# Patient Record
Sex: Female | Born: 1995 | Race: White | Hispanic: No | Marital: Single | Smoking: Never smoker
Health system: Southern US, Community
[De-identification: ages and names within clinical notes are randomized; demographics above are authoritative.]

## PROBLEM LIST (undated history)

## (undated) DIAGNOSIS — G43909 Migraine, unspecified, not intractable, without status migrainosus: Secondary | ICD-10-CM

## (undated) DIAGNOSIS — F419 Anxiety disorder, unspecified: Secondary | ICD-10-CM

## (undated) DIAGNOSIS — J309 Allergic rhinitis, unspecified: Secondary | ICD-10-CM

## (undated) DIAGNOSIS — F32A Depression, unspecified: Secondary | ICD-10-CM

## (undated) DIAGNOSIS — F909 Attention-deficit hyperactivity disorder, unspecified type: Secondary | ICD-10-CM

## (undated) HISTORY — DX: Attention-deficit hyperactivity disorder, unspecified type: F90.9

## (undated) HISTORY — DX: Allergic rhinitis, unspecified: J30.9

## (undated) HISTORY — DX: Depression, unspecified: F32.A

## (undated) HISTORY — PX: NO PAST SURGERIES: SHX2092

## (undated) HISTORY — DX: Anxiety disorder, unspecified: F41.9

---
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
## 2020-04-19 DIAGNOSIS — J302 Other seasonal allergic rhinitis: Secondary | ICD-10-CM | POA: Diagnosis not present

## 2020-04-19 DIAGNOSIS — R059 Cough, unspecified: Secondary | ICD-10-CM | POA: Diagnosis not present

## 2020-04-19 DIAGNOSIS — R52 Pain, unspecified: Secondary | ICD-10-CM | POA: Diagnosis not present

## 2020-05-10 DIAGNOSIS — Z79899 Other long term (current) drug therapy: Secondary | ICD-10-CM | POA: Diagnosis not present

## 2020-05-11 DIAGNOSIS — Z79899 Other long term (current) drug therapy: Secondary | ICD-10-CM | POA: Diagnosis not present

## 2020-05-11 DIAGNOSIS — F8181 Disorder of written expression: Secondary | ICD-10-CM | POA: Diagnosis not present

## 2020-05-11 DIAGNOSIS — F419 Anxiety disorder, unspecified: Secondary | ICD-10-CM | POA: Diagnosis not present

## 2020-05-11 DIAGNOSIS — F902 Attention-deficit hyperactivity disorder, combined type: Secondary | ICD-10-CM | POA: Diagnosis not present

## 2020-05-17 DIAGNOSIS — Z0000 Encounter for general adult medical examination without abnormal findings: Secondary | ICD-10-CM | POA: Diagnosis not present

## 2020-05-29 ENCOUNTER — Encounter: Payer: Self-pay | Admitting: Registered"

## 2020-05-29 ENCOUNTER — Encounter: Payer: BC Managed Care – PPO | Attending: Obstetrics and Gynecology | Admitting: Registered"

## 2020-05-29 ENCOUNTER — Other Ambulatory Visit: Payer: Self-pay

## 2020-05-29 DIAGNOSIS — E282 Polycystic ovarian syndrome: Secondary | ICD-10-CM | POA: Insufficient documentation

## 2020-05-29 DIAGNOSIS — Z713 Dietary counseling and surveillance: Secondary | ICD-10-CM | POA: Insufficient documentation

## 2020-05-29 NOTE — Progress Notes (Signed)
Medical Nutrition Therapy  Appointment Start time:  0830  Appointment End time:  0930  Primary concerns today: PCOS, wants to prevent diabetes and other issues  Referral diagnosis: E28.2 (ICD-10-CM) - PCOS (polycystic ovarian syndrome) Preferred learning style: no preference indicated Learning readiness: change in progress   NUTRITION ASSESSMENT   Anthropometrics  Not assessed   Clinical Medical Hx: PCOS, anxiety, ADD Medications: reviewed Labs: PCOS panel; FBS 100 (pt reported) Notable Signs/Symptoms: low energy, irregular periods  Lifestyle & Dietary Hx Patient states after diagnosis she made some changes and starting doing more meal prep and eating out less. Pt states she really enjoys cheese bites from [employer] and used to get them as part of a meal that included a large drink and fries. Pt states she has stopped doing that.  Patient states part of her fear of the potential risks associated with PCOS comes from her family history of diabetes (grandmother). Pt reports she has a twin sister who struggles with her weight but doesn't have PCOS.  Patient reports energy levels go up and down through the day; coffee peps her up, sleepy after eating.   Provided supplement sample:  Ovasitol Lot # [id number], Pst: 08/22  Estimated daily fluid intake: water 30 oz, coffee 20 oz Supplements: none Sleep: 8+ hours, wakes up 50% of the times Stress / self-care: 7/10 avg weekly a lot due to new employment / game on phone plays with people all over the world, cats, videos, vent with mom Current average weekly physical activity: ADLs, takes stairs  24-Hr Dietary Recall First Meal: skip, coffee oat creamer, cracker Snack: none Second Meal: vegetable sautee, hard boiled eggs, strawberries, almonds. Snack: "I wish I had time at work" Third Meal: cheese sticks, vegetables OR salmon Snack: none  Beverages: water, coffee  Estimated Energy Needs Calories: ~2,000    NUTRITION DIAGNOSIS  NB-1.1  Food and nutrition-related knowledge deficit As related to how food affects PCOS.  As evidenced by patient questions about what to eat for PCOS.   NUTRITION INTERVENTION  Nutrition education (E-1) on the following topics:  . PCOS pathophysiology . PCOS symptoms . Supplements . Balanced Eating  Handouts Provided Include   PCOS Guide for Parents  Inositol and PCOS (and sample)  Should I Eat? Flowsheet  Learning Style & Readiness for Change Teaching method utilized: Visual & Auditory  Demonstrated degree of understanding via: Teach Back  Barriers to learning/adherence to lifestyle change: none  Goals Established by Pt . Work on following general guidelines of MyPlate . Continue to listen to body cues as to what feels good and why . Review 'Should I Eat' flowsheet   MONITORING & EVALUATION Dietary intake, weekly physical activity, and suppplements in 4-6 weeks.  Next Steps  Patient is to work on goals and return for more in-depth discussion on mindful eating and how physical activity plays a role. Next visit will look at reading food labels.

## 2020-05-29 NOTE — Patient Instructions (Signed)
Use MyPlate as a guide for balanced eating Consider eating some protein in the morning before work Protein shakes may be a way to have something in between meals when hungry at work. Review the Am I Hungry handout for discussion at next visit

## 2020-07-17 ENCOUNTER — Ambulatory Visit: Payer: BC Managed Care – PPO | Admitting: Registered"

## 2020-08-25 DIAGNOSIS — Z79899 Other long term (current) drug therapy: Secondary | ICD-10-CM | POA: Diagnosis not present

## 2020-08-25 DIAGNOSIS — F902 Attention-deficit hyperactivity disorder, combined type: Secondary | ICD-10-CM | POA: Diagnosis not present

## 2021-09-19 ENCOUNTER — Encounter: Payer: Self-pay | Admitting: *Deleted

## 2021-09-20 ENCOUNTER — Ambulatory Visit: Payer: BC Managed Care – PPO | Admitting: Neurology

## 2021-09-20 ENCOUNTER — Encounter: Payer: Self-pay | Admitting: Neurology

## 2021-09-20 VITALS — BP 123/78 | HR 83 | Ht 62.0 in | Wt 234.8 lb

## 2021-09-20 DIAGNOSIS — G43101 Migraine with aura, not intractable, with status migrainosus: Secondary | ICD-10-CM

## 2021-09-20 DIAGNOSIS — R51 Headache with orthostatic component, not elsewhere classified: Secondary | ICD-10-CM

## 2021-09-20 DIAGNOSIS — H539 Unspecified visual disturbance: Secondary | ICD-10-CM

## 2021-09-20 DIAGNOSIS — R519 Headache, unspecified: Secondary | ICD-10-CM

## 2021-09-20 DIAGNOSIS — R200 Anesthesia of skin: Secondary | ICD-10-CM

## 2021-09-20 DIAGNOSIS — E669 Obesity, unspecified: Secondary | ICD-10-CM

## 2021-09-20 DIAGNOSIS — R4701 Aphasia: Secondary | ICD-10-CM

## 2021-09-20 DIAGNOSIS — R29810 Facial weakness: Secondary | ICD-10-CM

## 2021-09-20 MED ORDER — ONDANSETRON 4 MG PO TBDP: 4.0000 mg | ORAL_TABLET | Freq: Three times a day (TID) | ORAL | 3 refills | Status: DC | PRN

## 2021-09-20 MED ORDER — RIZATRIPTAN BENZOATE 10 MG PO TBDP: 10.0000 mg | ORAL_TABLET | ORAL | 11 refills | Status: DC | PRN

## 2021-09-20 MED ORDER — NURTEC 75 MG PO TBDP: 75.0000 mg | ORAL_TABLET | Freq: Every day | ORAL | 0 refills | Status: DC | PRN

## 2021-09-20 NOTE — Patient Instructions (Addendum)
- FL-41 filter for glasses(theraspects, somnilight) and blue light filters - Emergent: RIGHT at the onset of migraine take Rizatriptan: Please take one tablet at the onset of your headache. If it does not improve the symptoms please take one additional tablet. Do not take more then 2 tablets in 24hrs. Do not take use more then 2 to 3 times in a week. If you don;t like the triptans, would move to the new medications Ubrelvy or Nurtec - Nurtec: At onset of headache/migraine, 12 hour 1/2 life so it lasts a long time. Can take it alone or take with the rizatriptan and the ondansetron.  - Ondansetron for nausea: Take it with Rizatriptan  - Neck tightness - dry needling([email or web address] or physical therapy) or can try chiropractor or Flexeril at bedtime for neck spasms - IF we decide on a preventative, would try Topiramate/Topamax and then move to the newer meds like Nurtec, qulipta, Ajovy, Emgality - MRI of the brain w/wo (If expensive can hold off) Rimegepant Disintegrating Tablets What is this medication? RIMEGEPANT (ri ME je pant) prevents and treats migraines. It works by blocking a substance in the body that causes migraines. This medicine may be used for other purposes; ask your health care provider or pharmacist if you have questions. COMMON BRAND NAME(S): NURTEC ODT What should I tell my care team before I take this medication? They need to know if you have any of these conditions: Kidney disease Liver disease An unusual or allergic reaction to rimegepant, other medications, foods, dyes, or preservatives Pregnant or trying to get pregnant Breast-feeding How should I use this medication? Take this medication by mouth. Take it as directed on the prescription label. Leave the tablet in the sealed pack until you are ready to take it. With dry hands, open the pack and gently remove the tablet. If the tablet breaks or crumbles, throw it away. Use a new tablet. Place the tablet in the mouth and allow  it to dissolve. Then, swallow it. Do not cut, crush, or chew this medication. You do not need water to take this medication. Talk to your care team about the use of this medication in children. Special care may be needed. Overdosage: If you think you have taken too much of this medicine contact a poison control center or emergency room at once. NOTE: This medicine is only for you. Do not share this medicine with others. What if I miss a dose? This does not apply. This medication is not for regular use. What may interact with this medication? Certain medications for fungal infections, such as fluconazole, itraconazole Rifampin This list may not describe all possible interactions. Give your health care provider a list of all the medicines, herbs, non-prescription drugs, or dietary supplements you use. Also tell them if you smoke, drink alcohol, or use illegal drugs. Some items may interact with your medicine. What should I watch for while using this medication? Visit your care team for regular checks on your progress. Tell your care team if your symptoms do not start to get better or if they get worse. What side effects may I notice from receiving this medication? Side effects that you should report to your care team as soon as possible: Allergic reactions--skin rash, itching, hives, swelling of the face, lips, tongue, or throat Side effects that usually do not require medical attention (report to your care team if they continue or are bothersome): Nausea Stomach pain This list may not describe all possible side effects. Call your doctor  for medical advice about side effects. You may report side effects to FDA at 1-800-FDA-1088. Where should I keep my medication? Keep out of the reach of children and pets. Store at room temperature between 20 and 25 degrees C (68 and 77 degrees F). Get rid of any unused medication after the expiration date. To get rid of medications that are no longer needed or  have expired: Take the medication to a medication take-back program. Check with your pharmacy or law enforcement to find a location. If you cannot return the medication, check the label or package insert to see if the medication should be thrown out in the garbage or flushed down the toilet. If you are not sure, ask your care team. If it is safe to put it in the trash, take the medication out of the container. Mix the medication with cat litter, dirt, coffee grounds, or other unwanted substance. Seal the mixture in a bag or container. Put it in the trash. NOTE: This sheet is a summary. It may not cover all possible information. If you have questions about this medicine, talk to your doctor, pharmacist, or health care provider.  2023 Elsevier/Gold Standard (2021-05-09 00:00:00) Rizatriptan Disintegrating Tablets What is this medication? RIZATRIPTAN (rye za TRIP tan) treats migraines. It works by blocking pain signals and narrowing blood vessels in the brain. It belongs to a group of medications called triptans. It is not used to prevent migraines. This medicine may be used for other purposes; ask your health care provider or pharmacist if you have questions. COMMON BRAND NAME(S): Maxalt-MLT What should I tell my care team before I take this medication? They need to know if you have any of these conditions: Cigarette smoker Circulation problems in fingers and toes Diabetes Heart disease High blood pressure High cholesterol History of irregular heartbeat History of stroke Kidney disease Liver disease Stomach or intestine problems An unusual or allergic reaction to rizatriptan, other medications, foods, dyes, or preservatives Pregnant or trying to get pregnant Breast-feeding How should I use this medication? Take this medication by mouth. Follow the directions on the prescription label. Leave the tablet in the sealed blister pack until you are ready to take it. With dry hands, open the  blister and gently remove the tablet. If the tablet breaks or crumbles, throw it away and take a new tablet out of the blister pack. Place the tablet in the mouth and allow it to dissolve, and then swallow. Do not cut, crush, or chew this medication. You do not need water to take this medication. Do not take it more often than directed. Talk to your care team regarding the use of this medication in children. While this medication may be prescribed for children as young as 6 years for selected conditions, precautions do apply. Overdosage: If you think you have taken too much of this medicine contact a poison control center or emergency room at once. NOTE: This medicine is only for you. Do not share this medicine with others. What if I miss a dose? This does not apply. This medication is not for regular use. What may interact with this medication? Do not take this medication with any of the following medications: Certain medications for migraine headache like almotriptan, eletriptan, frovatriptan, naratriptan, rizatriptan, sumatriptan, zolmitriptan Ergot alkaloids like dihydroergotamine, ergonovine, ergotamine, methylergonovine MAOIs like Carbex, Eldepryl, Marplan, Nardil, and Parnate This medication may also interact with the following medications: Certain medications for depression, anxiety, or psychotic disorders Propranolol This list may not describe  all possible interactions. Give your health care provider a list of all the medicines, herbs, non-prescription drugs, or dietary supplements you use. Also tell them if you smoke, drink alcohol, or use illegal drugs. Some items may interact with your medicine. What should I watch for while using this medication? Visit your care team for regular checks on your progress. Tell your care team if your symptoms do not start to get better or if they get worse. You may get drowsy or dizzy. Do not drive, use machinery, or do anything that needs mental alertness  until you know how this medication affects you. Do not stand up or sit up quickly, especially if you are an older patient. This reduces the risk of dizzy or fainting spells. Alcohol may interfere with the effect of this medication. Your mouth may get dry. Chewing sugarless gum or sucking hard candy and drinking plenty of water may help. Contact your care team if the problem does not go away or is severe. If you take migraine medications for 10 or more days a month, your migraines may get worse. Keep a diary of headache days and medication use. Contact your care team if your migraine attacks occur more frequently. What side effects may I notice from receiving this medication? Side effects that you should report to your care team as soon as possible: Allergic reactions--skin rash, itching, hives, swelling of the face, lips, tongue, or throat Burning, pain, tingling, or color changes in the legs or feet Heart attack--pain or tightness in the chest, shoulders, arms, or jaw, nausea, shortness of breath, cold or clammy skin, feeling faint or lightheaded Heart rhythm changes--fast or irregular heartbeat, dizziness, feeling faint or lightheaded, chest pain, trouble breathing Increase in blood pressure Irritability, confusion, fast or irregular heartbeat, muscle stiffness, twitching muscles, sweating, high fever, seizure, chills, vomiting, diarrhea, which may be signs of serotonin syndrome Raynaud's--cool, numb, or painful fingers or toes that may change color from pale, to blue, to red Seizures Stroke--sudden numbness or weakness of the face, arm, or leg, trouble speaking, confusion, trouble walking, loss of balance or coordination, dizziness, severe headache, change in vision Sudden or severe stomach pain, nausea, vomiting, fever, or bloody diarrhea Vision loss Side effects that usually do not require medical attention (report to your care team if they continue or are bothersome): Dizziness General  discomfort or fatigue This list may not describe all possible side effects. Call your doctor for medical advice about side effects. You may report side effects to FDA at 1-800-FDA-1088. Where should I keep my medication? Keep out of the reach of children and pets. Store at room temperature between 15 and 30 degrees C (59 and 86 degrees F). Protect from light and moisture. Throw away any unused medication after the expiration date. NOTE: This sheet is a summary. It may not cover all possible information. If you have questions about this medicine, talk to your doctor, pharmacist, or health care provider.  2023 Elsevier/Gold Standard (2020-04-26 00:00:00) Ondansetron Dissolving Tablets What is this medication? ONDANSETRON (on DAN se tron) prevents nausea and vomiting from chemotherapy, radiation, or surgery. It works by blocking substances in the body that may cause nausea or vomiting. It belongs to a group of medications called antiemetics. This medicine may be used for other purposes; ask your health care provider or pharmacist if you have questions. COMMON BRAND NAME(S): Zofran ODT What should I tell my care team before I take this medication? They need to know if you have any of  these conditions: Heart disease History of irregular heartbeat Liver disease Low levels of magnesium or potassium in the blood An unusual or allergic reaction to ondansetron, granisetron, other medications, foods, dyes, or preservatives Pregnant or trying to get pregnant Breast-feeding How should I use this medication? These tablets are made to dissolve in the mouth. Do not try to push the tablet through the foil backing. With dry hands, peel away the foil backing and gently remove the tablet. Place the tablet in the mouth and allow it to dissolve, then swallow. While you may take these tablets with water, it is not necessary to do so. Talk to your care team regarding the use of this medication in children. Special  care may be needed. Overdosage: If you think you have taken too much of this medicine contact a poison control center or emergency room at once. NOTE: This medicine is only for you. Do not share this medicine with others. What if I miss a dose? If you miss a dose, take it as soon as you can. If it is almost time for your next dose, take only that dose. Do not take double or extra doses. What may interact with this medication? Do not take this medication with any of the following: Apomorphine Certain medications for fungal infections like fluconazole, itraconazole, ketoconazole, posaconazole, voriconazole Cisapride Dronedarone Pimozide Thioridazine This medication may also interact with the following: Carbamazepine Certain medications for depression, anxiety, or psychotic disturbances Fentanyl Linezolid MAOIs like Carbex, Eldepryl, Marplan, Nardil, and Parnate Methylene blue (injected into a vein) Other medications that prolong the QT interval (cause an abnormal heart rhythm) like dofetilide, ziprasidone Phenytoin Rifampicin Tramadol This list may not describe all possible interactions. Give your health care provider a list of all the medicines, herbs, non-prescription drugs, or dietary supplements you use. Also tell them if you smoke, drink alcohol, or use illegal drugs. Some items may interact with your medicine. What should I watch for while using this medication? Check with your care team as soon as you can if you have any sign of an allergic reaction. What side effects may I notice from receiving this medication? Side effects that you should report to your care team as soon as possible: Allergic reactions--skin rash, itching, hives, swelling of the face, lips, tongue, or throat Bowel blockage--stomach cramping, unable to have a bowel movement or pass gas, loss of appetite, vomiting Chest pain (angina)--pain, pressure, or tightness in the chest, neck, back, or arms Heart rhythm  changes--fast or irregular heartbeat, dizziness, feeling faint or lightheaded, chest pain, trouble breathing Irritability, confusion, fast or irregular heartbeat, muscle stiffness, twitching muscles, sweating, high fever, seizure, chills, vomiting, diarrhea, which may be signs of serotonin syndrome Side effects that usually do not require medical attention (report to your care team if they continue or are bothersome): Constipation Diarrhea General discomfort and fatigue Headache This list may not describe all possible side effects. Call your doctor for medical advice about side effects. You may report side effects to FDA at 1-800-FDA-1088. Where should I keep my medication? Keep out of the reach of children and pets. Store between 2 and 30 degrees C (36 and 86 degrees F). Throw away any unused medication after the expiration date. NOTE: This sheet is a summary. It may not cover all possible information. If you have questions about this medicine, talk to your doctor, pharmacist, or health care provider.  2023 Elsevier/Gold Standard (2020-04-21 00:00:00)

## 2021-09-20 NOTE — Progress Notes (Unsigned)
[facility]    Provider:  Dr Lucia Gaskins Requesting Provider: Townsend Roger* Primary Care Provider:  Theodosia Paling, MD  CC:  ***  HPI:  Diana Mosley is a 26 y.o. female here as requested by Hervey Ard, Demetrius Charity* for migraines. She has a PMHx of migraines. PCOS since the age of 19. She has aura. She can't take estrogen she knows about stroke risk with migraine with aura. She has had migraine with rainbow sqigglies in both eyes, then followed by a migraine headache, she has tingling and moves up to the elbows in either hand and sometimes in both. She can lose feelings in her leg and arm eithr side, tingling. Happens before the migraine headache and the migraines on the right with pulsating/pounding/throbbing, photo/phonophobia, nausea, vomiting, hurts to move, she has been to the ED, she has been confused and slurring and facial droop. She has 1-2 migraine days a month. Nothing helps, she tries to take ibuprofen but doesn't touch it.   Esotropia right eye    Reviewed notes, labs and imaging from outside physicians, which showed ***  Prozac, Toradol injections, Reglan, Zofran, Compazine,  Review of Systems: Patient complains of symptoms per HPI as well as the following symptoms ***. Pertinent negatives and positives per HPI. All others negative.   Social History   Socioeconomic History   Marital status: Single    Spouse name: Not on file   Number of children: Not on file   Years of education: Not on file   Highest education level: Not on file  Occupational History   Not on file  Tobacco Use   Smoking status: Never   Smokeless tobacco: Never  Substance and Sexual Activity   Alcohol use: No   Drug use: No   Sexual activity: Never  Other Topics Concern   Not on file  Social History Narrative   Not on file   Social Determinants of Health   Financial Resource Strain: Not on file  Food Insecurity: Not on file  Transportation Needs: Not on  file  Physical Activity: Not on file  Stress: Not on file  Social Connections: Not on file  Intimate Partner Violence: Not on file    Family History  Problem Relation Age of Onset   Pulmonary fibrosis Maternal Grandfather        Died at 17   Heart disease Maternal Grandfather     Past Medical History:  Diagnosis Date   ADHD    Allergic rhinitis    Anxiety    Depression    Migraines     Patient Active Problem List   Diagnosis Date Noted   Nutritional counseling 05/29/2020   Polycystic ovarian disease 05/29/2020   Chronic tension type headache 05/14/2013   Migraine with aura, without mention of intractable migraine without mention of status migrainosus 05/14/2013    No past surgical history on file.  Current Outpatient Medications  Medication Sig Dispense Refill   Drospirenone (SLYND) 4 MG TABS Take by mouth.     FLUoxetine (PROZAC) 40 MG capsule Take 40 mg by mouth daily.     ibuprofen (ADVIL,MOTRIN) 200 MG tablet Take 200 mg by mouth every 6 (six) hours as needed for headache (2 By mouth as needed).     methylphenidate 36 MG PO CR tablet Concerta 36 mg tablet,extended release  Take 1 tablet every day by oral route.     Probiotic Product (ALIGN) 4 MG CAPS Take by mouth.  No current facility-administered medications for this visit.    Allergies as of 09/20/2021 - Review Complete 05/29/2020  Allergen Reaction Noted   Other  01/04/2013    Vitals: There were no vitals taken for this visit. Last Weight:  Wt Readings from Last 1 Encounters:  05/14/13 161 lb (73 kg) (91 %, Z= 1.33)*   * Growth percentiles are based on CDC (Girls, 2-20 Years) data.   Last Height:   Ht Readings from Last 1 Encounters:  05/14/13 5\' 2"  (1.575 m) (20 %, Z= -0.86)*   * Growth percentiles are based on CDC (Girls, 2-20 Years) data.     Physical exam: Exam: Gen: NAD, conversant, well nourised, obese, well groomed                     CV: RRR, no MRG. No Carotid Bruits. No  peripheral edema, warm, nontender Eyes: Conjunctivae clear without exudates or hemorrhage  Neuro: Detailed Neurologic Exam  Speech:    Speech is normal; fluent and spontaneous with normal comprehension.  Cognition:    The patient is oriented to person, place, and time;     recent and remote memory intact;     language fluent;     normal attention, concentration,     fund of knowledge Cranial Nerves:    The pupils are equal, round, and reactive to light. The fundi are normal and spontaneous venous pulsations are present. Visual fields are full to finger confrontation. Extraocular movements are intact. Trigeminal sensation is intact and the muscles of mastication are normal. The face is symmetric. The palate elevates in the midline. Hearing intact. Voice is normal. Shoulder shrug is normal. The tongue has normal motion without fasciculations.   Coordination:    Normal finger to nose and heel to shin. Normal rapid alternating movements.   Gait:    Heel-toe and tandem gait are normal.   Motor Observation:    No asymmetry, no atrophy, and no involuntary movements noted. Tone:    Normal muscle tone.    Posture:    Posture is normal. normal erect    Strength:    Strength is V/V in the upper and lower limbs.      Sensation: intact to LT     Reflex Exam:  DTR's:    Deep tendon reflexes in the upper and lower extremities are normal bilaterally.   Toes:    The toes are downgoing bilaterally.   Clonus:    Clonus is absent.    Assessment/Plan:    No orders of the defined types were placed in this encounter.  No orders of the defined types were placed in this encounter.   Cc: Hyatt, , Neita Garnet, MD  Fawn Kirk, MD  [facility] [address] [place], [place]  Phone [phone] Fax [phone]

## 2021-09-23 ENCOUNTER — Encounter: Payer: Self-pay | Admitting: Neurology

## 2021-09-23 DIAGNOSIS — G43101 Migraine with aura, not intractable, with status migrainosus: Secondary | ICD-10-CM | POA: Insufficient documentation

## 2021-09-27 ENCOUNTER — Telehealth: Payer: Self-pay | Admitting: Neurology

## 2021-09-27 NOTE — Telephone Encounter (Signed)
Diana Mosley: [id number] exp. 09/27/21-10/26/21 sent to [facility] [phone]

## 2021-12-31 ENCOUNTER — Telehealth: Payer: Self-pay | Admitting: Neurology

## 2021-12-31 ENCOUNTER — Telehealth (INDEPENDENT_AMBULATORY_CARE_PROVIDER_SITE_OTHER): Payer: BC Managed Care – PPO | Admitting: Neurology

## 2021-12-31 DIAGNOSIS — G43101 Migraine with aura, not intractable, with status migrainosus: Secondary | ICD-10-CM | POA: Diagnosis not present

## 2021-12-31 MED ORDER — UBRELVY 100 MG PO TABS: 100.0000 mg | ORAL_TABLET | ORAL | 11 refills | Status: DC | PRN

## 2021-12-31 NOTE — Telephone Encounter (Signed)
Pt scheduled for video visit with Megan on 07/23/22 at 3:15pm

## 2021-12-31 NOTE — Progress Notes (Signed)
[facility]    Provider:  Dr Jaynee Eagles Requesting Provider: Arlana Pouch, MD Primary Care Provider:  Arlana Pouch, MD  CC:  migraines  Virtual Visit via Video Note  I connected with [name] on 12/31/21 at  3:30 PM EDT by a video enabled telemedicine application and verified that I am speaking with the correct person using two identifiers.  Location: Patient: home Provider: office   I discussed the limitations of evaluation and management by telemedicine and the availability of in person appointments. The patient expressed understanding and agreed to proceed.   Follow Up Instructions:    I discussed the assessment and treatment plan with the patient. The patient was provided an opportunity to ask questions and all were answered. The patient agreed with the plan and demonstrated an understanding of the instructions.   The patient was advised to call back or seek an in-person evaluation if the symptoms worsen or if the condition fails to improve as anticipated.  I provided 15 minutes of non-face-to-face time during this encounter.   Melvenia Beam, MD  12/31/2021: She did use the nurtec it did not help. We discussed options. I would also try the rizatriptan again. Send some ubrelvy and see if that works. Ondansetron worked for nausea. She has less than 14 headache days a month and 4 migraine days a month. Tried nurtec, imitrex and maxalt. However she has had a good migraine few months with few.   Patient complains of symptoms per HPI as well as the following symptoms: aura . Pertinent negatives and positives per HPI. All others negative   HPI:  Diana Mosley is a 26 y.o. female here as requested by Arlana Pouch, MD for migraines. She has a PMHx of migraines. PCOS since the age of 76. She has aura. She can't take estrogen she knows about stroke risk with migraine with aura. She has had migraine with rainbow sqigglies in both eyes, then  followed by a migraine headache, she has tingling and moves up to the elbows in either hand and sometimes in both. She can lose feelings in her leg and arm eithr side, tingling. Happens before the migraine headache and the migraines on the right with pulsating/pounding/throbbing, photo/phonophobia, nausea, vomiting, hurts to move or bend over worse with position, worsening headache severity and new symptoms of paresthesias and numbness of limbs, vision changes, she has been to the ED, she has been confused and slurring and facial droop. She has 1-2 migraine days a month and each can last 2-3 days so up to 6 migraine days a month. Nothing helps, she tries to take ibuprofen but doesn't touch it. No other focal neurologic deficits, associated symptoms, inciting events or modifiable factors.  Reviewed notes, labs and imaging from outside physicians, which showed:  From a thorough review of records, Medications tried that can be used in migraine management: Prozac, Toradol injections, Reglan, Zofran, Compazine,amitriptyline, propranolol contraindicated due to blood pressure, sumatriptan, rizatriptan, nurtec  Also reviewed dr hickling's notes from 2015 who diagnosed her tension type headaches and discussed conservative measures.   Review of Systems: Patient complains of symptoms per HPI as well as the following symptoms headache. Pertinent negatives and positives per HPI. All others negative.   Social History   Socioeconomic History   Marital status: Single    Spouse name: Not on file   Number of children: Not on file   Years of education: Not on file   Highest education level: Not  on file  Occupational History   Not on file  Tobacco Use   Smoking status: Never   Smokeless tobacco: Never  Substance and Sexual Activity   Alcohol use: No   Drug use: No   Sexual activity: Never  Other Topics Concern   Not on file  Social History Narrative   Not on file   Social Determinants of Health    Financial Resource Strain: Not on file  Food Insecurity: Not on file  Transportation Needs: Not on file  Physical Activity: Not on file  Stress: Not on file  Social Connections: Not on file  Intimate Partner Violence: Not on file    Family History  Problem Relation Age of Onset   Migraines Father    Migraines Maternal Grandmother    Pulmonary fibrosis Maternal Grandfather        Died at 87   Heart disease Maternal Grandfather     Past Medical History:  Diagnosis Date   ADHD    Allergic rhinitis    Anxiety    Depression    Migraines     Patient Active Problem List   Diagnosis Date Noted   Migraine with aura and with status migrainosus, not intractable 09/23/2021   Nutritional counseling 05/29/2020   Polycystic ovarian disease 05/29/2020   Chronic tension type headache 05/14/2013   Migraine with aura, without mention of intractable migraine without mention of status migrainosus 05/14/2013    Past Surgical History:  Procedure Laterality Date   NO PAST SURGERIES      Current Outpatient Medications  Medication Sig Dispense Refill   Ubrogepant (UBRELVY) 100 MG TABS Take 100 mg by mouth every 2 (two) hours as needed. Maximum 200mg  a day. 16 tablet 11   Cetirizine HCl (ZYRTEC PO) Take by mouth daily.     Drospirenone (SLYND) 4 MG TABS Take by mouth.     FLUoxetine (PROZAC) 40 MG capsule Take 40 mg by mouth daily.     ibuprofen (ADVIL,MOTRIN) 200 MG tablet Take 200 mg by mouth every 6 (six) hours as needed for headache (2 By mouth as needed).     ondansetron (ZOFRAN-ODT) 4 MG disintegrating tablet Take 1-2 tablets (4-8 mg total) by mouth every 8 (eight) hours as needed. 30 tablet 3   Probiotic Product (ALIGN) 4 MG CAPS Take by mouth.     Rimegepant Sulfate (NURTEC) 75 MG TBDP Take 75 mg by mouth daily as needed. For migraines. Take as close to onset of migraine as possible. One daily maximum. 8 tablet 0   rizatriptan (MAXALT-MLT) 10 MG disintegrating tablet Take 1 tablet  (10 mg total) by mouth as needed for migraine. May repeat in 2 hours if needed 9 tablet 11   No current facility-administered medications for this visit.    Allergies as of 12/31/2021 - Review Complete 09/20/2021  Allergen Reaction Noted   Other  01/04/2013    Vitals: There were no vitals taken for this visit. Last Weight:  Wt Readings from Last 1 Encounters:  09/20/21 234 lb 12.8 oz (106.5 kg)   Last Height:   Ht Readings from Last 1 Encounters:  09/20/21 5\' 2"  (1.575 m)      Physical exam: Exam: Gen: NAD, conversant      CV:  Denies palpitations or chest pain or SOB. VS: Breathing at a normal rate. Not febrile. Eyes: Conjunctivae clear without exudates or hemorrhage  Neuro: Detailed Neurologic Exam  Speech:    Speech is normal; fluent and spontaneous with normal  comprehension.  Cognition:    The patient is oriented to person, place, and time;     recent and remote memory intact;     language fluent;     normal attention, concentration,     fund of knowledge Cranial Nerves:    The pupils are equal, round, and reactive to light. Visual fields are full Extraocular movements are intact.  The face is symmetric with normal sensation. The palate elevates in the midline. Hearing intact. Voice is normal. Shoulder shrug is normal. The tongue has normal motion without fasciculations.   Motor Observation:   no involuntary movements noted. Tone:    Appears normal  Posture:    Posture is normal. normal erect    Strength:    Strength is anti-gravity and symmetric in the upper and lower limbs.      Sensation: intact to LT      Assessment/Plan:  Patient new to migraine management, with aura, episodic, spent extended amount of time educating on acute and preventative medications, migraine disorder, lifestyle factors, provided much literature for her to read.She did use the nurtec it did not help. We discussed options. I would also try the rizatriptan again. Send some ubrelvy  and see if that works. Ondansetron worked for nausea. She has less than 14 headache days a month and 4 migraine days a month. Tried nurtec, imitrex and maxalt. However she has had a good migraine few months with few.   - for light sensitivity:  FL-41 filter for glasses(theraspects, somnilight) and blue light filters - Emergent: RIGHT at the onset of migraine take Rizatriptan: Please take one tablet at the onset of your headache. If it does not improve the symptoms please take one additional tablet. Do not take more then 2 tablets in 24hrs. Do not take use more then 2 to 3 times in a week. Please try the rizatriptan. - Nurtec: At onset of headache/migraine, 12 hour 1/2 life so it lasts a long time. Can take it alone or take with the rizatriptan and the ondansetron. DID NOT HELP. Try Bernita Raisin. Can also try another triptan. - Ondansetron for nausea: Take it with Rizatriptan or the ubrelvy or alone for nausea - Neck tightness - dry needling(WWW[employer]britpt.com or physical therapy) or can try chiropractor or Flexeril at bedtime for neck spasms - IF we decide on a preventative, would try Topiramate/Topamax and then move to the newer meds like Nurtec, qulipta, Ajovy, Emgality depending on frequency. -  MRI brain ordered but not completed - discussed risk of stroke in patients with migraine with aura - discussed weight loss: obesity is a risk factor for progression to chronic migraines  From a thorough review of records, Medications tried that can be used in migraine management: Prozac, Toradol injections, Reglan, Zofran, Compazine,amitriptyline, propranolol contraindicated due to blood pressure, sumatriptan, rizatriptan, nurtec   No orders of the defined types were placed in this encounter.  Meds ordered this encounter  Medications   Ubrogepant (UBRELVY) 100 MG TABS    Sig: Take 100 mg by mouth every 2 (two) hours as needed. Maximum 200mg  a day.    Dispense:  16 tablet    Refill:  11    She has less than  14 headache days a month and 4 migraine days a month. Tried nurtec, imitrex and maxalt.    Cc: , MD,  Theodosia Paling, MD  Theodosia Paling, MD  [facility] [address] [place], [place]  Phone [phone] Fax  [phone]  I spent over 60 minutes of face-to-face and non-face-to-face time with patient on the  No diagnosis found.  diagnosis.  This included previsit chart review, lab review, study review, order entry, electronic health record documentation, patient education on the different diagnostic and therapeutic options, counseling and coordination of care, risks and benefits of management, compliance, or risk factor reduction

## 2021-12-31 NOTE — Telephone Encounter (Signed)
Please schedule in office or video appointment in April when patient would like, thanks can see Jinny Blossom please

## 2022-01-30 ENCOUNTER — Telehealth: Payer: Self-pay | Admitting: *Deleted

## 2022-01-30 NOTE — Telephone Encounter (Signed)
Roselyn Meier PA, Key: [id number] Your information has been submitted to [employer]. If [employer] has not responded to your request within 24 hours, contact [place] at [phone].

## 2022-01-30 NOTE — Telephone Encounter (Signed)
Received approval from [facility] [employer]. Ubrelvy 100 mg approved from 01/30/22 - 01/31/23. PA # [place] [phone] Non-Grandfathered [phone].  Approval faxed to pharmacy. Received a receipt of confirmation.

## 2022-07-23 ENCOUNTER — Telehealth (INDEPENDENT_AMBULATORY_CARE_PROVIDER_SITE_OTHER): Payer: BC Managed Care – PPO | Admitting: Adult Health

## 2022-07-23 DIAGNOSIS — G43101 Migraine with aura, not intractable, with status migrainosus: Secondary | ICD-10-CM | POA: Diagnosis not present

## 2022-07-23 MED ORDER — ZOLMITRIPTAN 5 MG NA SOLN: 1.0000 | NASAL | 11 refills | Status: AC | PRN

## 2022-07-23 NOTE — Progress Notes (Signed)
PATIENT: Diana Mosley DOB: 1995/07/05  REASON FOR VISIT: follow up HISTORY FROM: patient  Virtual Visit via Video Note  I connected with Diana Mosley on 07/23/22 at  2:15 PM EDT by a video enabled telemedicine application located remotely at [facility] and verified that I am speaking with the correct person using two identifiers who was located at their own home.   I discussed the limitations of evaluation and management by telemedicine and the availability of in person appointments. The patient expressed understanding and agreed to proceed.   PATIENT: Diana Mosley DOB: 1995-10-20  REASON FOR VISIT: follow up HISTORY FROM: patient  HISTORY OF PRESENT ILLNESS: Today 07/23/22:  Diana Mosley is a 27 y.o. female with a history of migraine headaches. Returns today for follow-up.  She reports that her last severe migraine was in February.  Reports sometimes she will get visual changes but is not followed by a headache.  Only will last for seconds.  She states that she has not done a great job of documenting her headaches due to being busy with work and life.  She states no abortive therapy has been helpful.  She most recently tried [place].  She has also tried Nurtec, rizatriptan and sumatriptan.     HISTORY 12/31/2021: She did use the nurtec it did not help. We discussed options. I would also try the rizatriptan again. Send some ubrelvy and see if that works. Ondansetron worked for nausea. She has less than 14 headache days a month and 4 migraine days a month. Tried nurtec, imitrex and maxalt. However she has had a good migraine few months with few.    Patient complains of symptoms per HPI as well as the following symptoms: aura . Pertinent negatives and positives per HPI. All others negative     HPI:  Diana Mosley is a 27 y.o. female here as requested by Theodosia Paling, MD for migraines. She has a PMHx of migraines. PCOS since the age of 74.  She has aura. She can't take estrogen she knows about stroke risk with migraine with aura. She has had migraine with rainbow sqigglies in both eyes, then followed by a migraine headache, she has tingling and moves up to the elbows in either hand and sometimes in both. She can lose feelings in her leg and arm eithr side, tingling. Happens before the migraine headache and the migraines on the right with pulsating/pounding/throbbing, photo/phonophobia, nausea, vomiting, hurts to move or bend over worse with position, worsening headache severity and new symptoms of paresthesias and numbness of limbs, vision changes, she has been to the ED, she has been confused and slurring and facial droop. She has 1-2 migraine days a month and each can last 2-3 days so up to 6 migraine days a month. Nothing helps, she tries to take ibuprofen but doesn't touch it. No other focal neurologic deficits, associated symptoms, inciting events or modifiable factors.   Reviewed notes, labs and imaging from outside physicians, which showed:   From a thorough review of records, Medications tried that can be used in migraine management: Prozac, Toradol injections, Reglan, Zofran, Compazine,amitriptyline, propranolol contraindicated due to blood pressure, sumatriptan, rizatriptan, nurtec   Also reviewed dr hickling's notes from 2015 who diagnosed her tension type headaches and discussed conservative measures.     REVIEW OF SYSTEMS: Out of a complete 14 system review of symptoms, the patient complains only of the following symptoms, and all other reviewed systems  are negative.  ALLERGIES: Allergies  Allergen Reactions   Other     Peaches     HOME MEDICATIONS: Outpatient Medications Prior to Visit  Medication Sig Dispense Refill   Cetirizine HCl (ZYRTEC PO) Take by mouth daily.     Drospirenone (SLYND) 4 MG TABS Take by mouth.     FLUoxetine (PROZAC) 40 MG capsule Take 40 mg by mouth daily.     ibuprofen (ADVIL,MOTRIN) 200 MG  tablet Take 200 mg by mouth every 6 (six) hours as needed for headache (2 By mouth as needed).     ondansetron (ZOFRAN-ODT) 4 MG disintegrating tablet Take 1-2 tablets (4-8 mg total) by mouth every 8 (eight) hours as needed. 30 tablet 3   Probiotic Product (ALIGN) 4 MG CAPS Take by mouth.     Rimegepant Sulfate (NURTEC) 75 MG TBDP Take 75 mg by mouth daily as needed. For migraines. Take as close to onset of migraine as possible. One daily maximum. 8 tablet 0   rizatriptan (MAXALT-MLT) 10 MG disintegrating tablet Take 1 tablet (10 mg total) by mouth as needed for migraine. May repeat in 2 hours if needed 9 tablet 11   Ubrogepant (UBRELVY) 100 MG TABS Take 100 mg by mouth every 2 (two) hours as needed. Maximum 200mg  a day. 16 tablet 11   No facility-administered medications prior to visit.    PAST MEDICAL HISTORY: Past Medical History:  Diagnosis Date   ADHD    Allergic rhinitis    Anxiety    Depression    Migraines     PAST SURGICAL HISTORY: Past Surgical History:  Procedure Laterality Date   NO PAST SURGERIES      FAMILY HISTORY: Family History  Problem Relation Age of Onset   Migraines Father    Migraines Maternal Grandmother    Pulmonary fibrosis Maternal Grandfather        Died at 30   Heart disease Maternal Grandfather     SOCIAL HISTORY: Social History   Socioeconomic History   Marital status: Single    Spouse name: Not on file   Number of children: Not on file   Years of education: Not on file   Highest education level: Not on file  Occupational History   Not on file  Tobacco Use   Smoking status: Never   Smokeless tobacco: Never  Substance and Sexual Activity   Alcohol use: No   Drug use: No   Sexual activity: Never  Other Topics Concern   Not on file  Social History Narrative   Not on file   Social Determinants of Health   Financial Resource Strain: Not on file  Food Insecurity: Not on file  Transportation Needs: Not on file  Physical Activity:  Not on file  Stress: Not on file  Social Connections: Not on file  Intimate Partner Violence: Not on file      PHYSICAL EXAM Generalized: Well developed, in no acute distress   Neurological examination  Mentation: Alert oriented to time, place, history taking. Follows all commands speech and language fluent Cranial nerve II-XII: Facial symmetry noted  DIAGNOSTIC DATA (LABS, IMAGING, TESTING) - I reviewed patient records, labs, notes, testing and imaging myself where available.  Lab Results  Component Value Date   HGB 15.3 01/04/2013   HCT 45.0 01/04/2013      Component Value Date/Time   NA 141 01/04/2013 2137   K 3.5 01/04/2013 2137   CL 104 01/04/2013 2137   GLUCOSE 106 (H) 01/04/2013 2137  BUN 10 01/04/2013 2137   CREATININE 0.90 01/04/2013 2137      ASSESSMENT AND PLAN 27 y.o. year old female  has a past medical history of ADHD, Allergic rhinitis, Anxiety, Depression, and Migraines. here with:  Migraine headaches  - Try zomig for abortive therapy - has already tried multiple other abortive therapies with no benefit - FU in 6-7 months or sooner if needed     Butch Penny, MSN, NP-C 07/23/2022, 2:33 PM [facility] [address], [address] [place], [place] [address] [phone]

## 2023-02-11 ENCOUNTER — Telehealth: Payer: BC Managed Care – PPO | Admitting: Adult Health

## 2023-02-11 DIAGNOSIS — G43101 Migraine with aura, not intractable, with status migrainosus: Secondary | ICD-10-CM

## 2023-02-11 NOTE — Patient Instructions (Signed)
Your Plan:  Consider Topamax 25 mg at bedtime. Read over topamax and send me a message if you want to start it.      Thank you for coming to see [place] at [facility]. I hope we have been able to provide you high quality care today.  You may receive a patient satisfaction survey over the next few weeks. We would appreciate your feedback and comments so that we may continue to improve ourselves and the health of our patients.

## 2023-02-11 NOTE — Progress Notes (Signed)
PATIENT: Diana Mosley DOB: September 02, 1995  REASON FOR VISIT: follow up HISTORY FROM: patient  Virtual Visit via Video Note  I connected with Paulino Rily on 02/11/23 at  3:30 PM EST by a video enabled telemedicine application located remotely at [facility] and verified that I am speaking with the correct person using two identifiers who was located at their own home.   I discussed the limitations of evaluation and management by telemedicine and the availability of in person appointments. The patient expressed understanding and agreed to proceed.   PATIENT: Diana Mosley DOB: 12-Jul-1995  REASON FOR VISIT: follow up HISTORY FROM: patient  HISTORY OF PRESENT ILLNESS: Today 02/11/23:  Diana Mosley is a 27 y.o. female with a history of migraine headache . Returns today for follow-up.  She states that her migraines are not that frequent.  She tends to only get a migraine when she is about to start her menstrual cycle.  However it does not occur every menstrual cycle.  At the last visit she was given a prescription of Zomig however she states that she forgot she had this and has not tried it yet.  She states that she does get a stress/tension headache about 3 days a week.  On a bad week it may be 5 out of 7 days.  She typically can take ibuprofen and it resolves quickly.  Has never tried any type of preventative medication for her headaches.    07/23/22: Diana Mosley is a 27 y.o. female with a history of migraine headaches. Returns today for follow-up.  She reports that her last severe migraine was in February.  Reports sometimes she will get visual changes but is not followed by a headache.  Only will last for seconds.  She states that she has not done a great job of documenting her headaches due to being busy with work and life.  She states no abortive therapy has been helpful.  She most recently tried [place].  She has also tried Nurtec, rizatriptan and  sumatriptan.     HISTORY 12/31/2021: She did use the nurtec it did not help. We discussed options. I would also try the rizatriptan again. Send some ubrelvy and see if that works. Ondansetron worked for nausea. She has less than 14 headache days a month and 4 migraine days a month. Tried nurtec, imitrex and maxalt. However she has had a good migraine few months with few.    Patient complains of symptoms per HPI as well as the following symptoms: aura . Pertinent negatives and positives per HPI. All others negative     HPI:  Diana Mosley is a 27 y.o. female here as requested by Theodosia Paling, MD for migraines. She has a PMHx of migraines. PCOS since the age of 18. She has aura. She can't take estrogen she knows about stroke risk with migraine with aura. She has had migraine with rainbow sqigglies in both eyes, then followed by a migraine headache, she has tingling and moves up to the elbows in either hand and sometimes in both. She can lose feelings in her leg and arm eithr side, tingling. Happens before the migraine headache and the migraines on the right with pulsating/pounding/throbbing, photo/phonophobia, nausea, vomiting, hurts to move or bend over worse with position, worsening headache severity and new symptoms of paresthesias and numbness of limbs, vision changes, she has been to the ED, she has been confused and slurring and facial droop.  She has 1-2 migraine days a month and each can last 2-3 days so up to 6 migraine days a month. Nothing helps, she tries to take ibuprofen but doesn't touch it. No other focal neurologic deficits, associated symptoms, inciting events or modifiable factors.   Reviewed notes, labs and imaging from outside physicians, which showed:   From a thorough review of records, Medications tried that can be used in migraine management: Prozac, Toradol injections, Reglan, Zofran, Compazine,amitriptyline, propranolol contraindicated due to blood pressure, sumatriptan,  rizatriptan, nurtec   Also reviewed dr hickling's notes from 2015 who diagnosed her tension type headaches and discussed conservative measures.     REVIEW OF SYSTEMS: Out of a complete 14 system review of symptoms, the patient complains only of the following symptoms, and all other reviewed systems are negative.  ALLERGIES: Allergies  Allergen Reactions   Other     Peaches     HOME MEDICATIONS: Outpatient Medications Prior to Visit  Medication Sig Dispense Refill   Cetirizine HCl (ZYRTEC PO) Take by mouth daily.     Drospirenone (SLYND) 4 MG TABS Take by mouth.     FLUoxetine (PROZAC) 40 MG capsule Take 40 mg by mouth daily.     ibuprofen (ADVIL,MOTRIN) 200 MG tablet Take 200 mg by mouth every 6 (six) hours as needed for headache (2 By mouth as needed).     ondansetron (ZOFRAN-ODT) 4 MG disintegrating tablet Take 1-2 tablets (4-8 mg total) by mouth every 8 (eight) hours as needed. 30 tablet 3   Probiotic Product (ALIGN) 4 MG CAPS Take by mouth.     zolmitriptan (ZOMIG) 5 MG nasal solution Place 1 spray into the nose as needed for migraine (at the onset of migriane). 6 each 11   No facility-administered medications prior to visit.    PAST MEDICAL HISTORY: Past Medical History:  Diagnosis Date   ADHD    Allergic rhinitis    Anxiety    Depression    Migraines     PAST SURGICAL HISTORY: Past Surgical History:  Procedure Laterality Date   NO PAST SURGERIES      FAMILY HISTORY: Family History  Problem Relation Age of Onset   Migraines Father    Migraines Maternal Grandmother    Pulmonary fibrosis Maternal Grandfather        Died at 75   Heart disease Maternal Grandfather     SOCIAL HISTORY: Social History   Socioeconomic History   Marital status: Single    Spouse name: Not on file   Number of children: Not on file   Years of education: Not on file   Highest education level: Not on file  Occupational History   Not on file  Tobacco Use   Smoking status: Never    Smokeless tobacco: Never  Substance and Sexual Activity   Alcohol use: No   Drug use: No   Sexual activity: Never  Other Topics Concern   Not on file  Social History Narrative   Not on file   Social Determinants of Health   Financial Resource Strain: Not on file  Food Insecurity: Not on file  Transportation Needs: Not on file  Physical Activity: Not on file  Stress: Not on file  Social Connections: Unknown (02/01/2022)   Received from [facility], [facility]   Social Network    Social Network: Not on file  Intimate Partner Violence: Unknown (02/01/2022)   [facility] from [facility], [facility]   HITS    Physically Hurt: Not  on file    Insult or Talk Down To: Not on file    Threaten Physical Harm: Not on file    Scream or Curse: Not on file      PHYSICAL EXAM Generalized: Well developed, in no acute distress   Neurological examination  Mentation: Alert oriented to time, place, history taking. Follows all commands speech and language fluent Cranial nerve II-XII: Facial symmetry noted  DIAGNOSTIC DATA (LABS, IMAGING, TESTING) - I reviewed patient records, labs, notes, testing and imaging myself where available.  Lab Results  Component Value Date   HGB 15.3 01/04/2013   HCT 45.0 01/04/2013      Component Value Date/Time   NA 141 01/04/2013 2137   K 3.5 01/04/2013 2137   CL 104 01/04/2013 2137   GLUCOSE 106 (H) 01/04/2013 2137   BUN 10 01/04/2013 2137   CREATININE 0.90 01/04/2013 2137      ASSESSMENT AND PLAN 27 y.o. year old female  has a past medical history of ADHD, Allergic rhinitis, Anxiety, Depression, and Migraines. here with:  Migraine headaches  -Try zomig for abortive therapy -Advised that if she wanted to try a preventative therapy we could try low-dose Topamax 25 mg at bedtime.  I did advise that Topamax is contraindicated in pregnancy,patient confirmed that she is not actively trying to get pregnant.  Reviewed potential side effects  of Topamax with the patient.  I provided information on her after visit summary.  She will review and send a MyChart message if she decides to try this. - FU in 3-4 months or sooner if needed     Butch Penny, MSN, NP-C 02/11/2023, 12:27 PM [facility] [address] [place], [place] [address] [phone]

## 2023-02-12 ENCOUNTER — Telehealth: Payer: BC Managed Care – PPO | Admitting: Adult Health

## 2023-06-24 ENCOUNTER — Telehealth (INDEPENDENT_AMBULATORY_CARE_PROVIDER_SITE_OTHER): Payer: BC Managed Care – PPO | Admitting: Adult Health

## 2023-06-24 DIAGNOSIS — G43101 Migraine with aura, not intractable, with status migrainosus: Secondary | ICD-10-CM

## 2023-06-24 MED ORDER — ONDANSETRON 4 MG PO TBDP: 4.0000 mg | ORAL_TABLET | Freq: Three times a day (TID) | ORAL | 1 refills | Status: AC | PRN

## 2023-06-24 NOTE — Progress Notes (Signed)
 PATIENT: Diana Mosley DOB: 1995/05/28  REASON FOR VISIT: follow up HISTORY FROM: patient  Virtual Visit via Video Note  I connected with Paulino Rily on 06/24/23 at  3:30 PM EDT by a video enabled telemedicine application located remotely at [facility] and verified that I am speaking with the correct person using two identifiers who was located at their own home.   I discussed the limitations of evaluation and management by telemedicine and the availability of in person appointments. The patient expressed understanding and agreed to proceed.   PATIENT: Diana Mosley DOB: 1995/05/17  REASON FOR VISIT: follow up HISTORY FROM: patient  HISTORY OF PRESENT ILLNESS: Today 06/24/23:  Diana Mosley is a 28 y.o. female with a history of migraine headaches. Returns today for follow-up.  Reports that she does get an aura with her migraines.  Typically will get rainbow zigzag line in her vision.  She states that she has not been keeping up with her migraines but they have not been occurring often.  She states that she has been having mild headaches due to pollen.  She also states that she has eliminated red wine and caffeine and that has helped.  She does use Zomig sometimes it takes up to 2 hours for she gets relief.  But she is happy with it.  She is on Slynd birth control which appears to not have estrogen in it-did caution the patient about risk of stroke with migraine with aura when using estrogen.  02/11/23: Diana Mosley is a 28 y.o. female with a history of migraine headache . Returns today for follow-up.  She states that her migraines are not that frequent.  She tends to only get a migraine when she is about to start her menstrual cycle.  However it does not occur every menstrual cycle.  At the last visit she was given a prescription of Zomig however she states that she forgot she had this and has not tried it yet.  She states that she does get a  stress/tension headache about 3 days a week.  On a bad week it may be 5 out of 7 days.  She typically can take ibuprofen and it resolves quickly.  Has never tried any type of preventative medication for her headaches.  07/23/22: Diana Mosley is a 28 y.o. female with a history of migraine headaches. Returns today for follow-up.  She reports that her last severe migraine was in February.  Reports sometimes she will get visual changes but is not followed by a headache.  Only will last for seconds.  She states that she has not done a great job of documenting her headaches due to being busy with work and life.  She states no abortive therapy has been helpful.  She most recently tried [place].  She has also tried Nurtec, rizatriptan and sumatriptan.     HISTORY 12/31/2021: She did use the nurtec it did not help. We discussed options. I would also try the rizatriptan again. Send some ubrelvy and see if that works. Ondansetron worked for nausea. She has less than 14 headache days a month and 4 migraine days a month. Tried nurtec, imitrex and maxalt. However she has had a good migraine few months with few.    Patient complains of symptoms per HPI as well as the following symptoms: aura . Pertinent negatives and positives per HPI. All others negative     HPI:  Diana Mosley is  a 28 y.o. female here as requested by Theodosia Paling, MD for migraines. She has a PMHx of migraines. PCOS since the age of 52. She has aura. She can't take estrogen she knows about stroke risk with migraine with aura. She has had migraine with rainbow sqigglies in both eyes, then followed by a migraine headache, she has tingling and moves up to the elbows in either hand and sometimes in both. She can lose feelings in her leg and arm eithr side, tingling. Happens before the migraine headache and the migraines on the right with pulsating/pounding/throbbing, photo/phonophobia, nausea, vomiting, hurts to move or bend over worse with  position, worsening headache severity and new symptoms of paresthesias and numbness of limbs, vision changes, she has been to the ED, she has been confused and slurring and facial droop. She has 1-2 migraine days a month and each can last 2-3 days so up to 6 migraine days a month. Nothing helps, she tries to take ibuprofen but doesn't touch it. No other focal neurologic deficits, associated symptoms, inciting events or modifiable factors.   Reviewed notes, labs and imaging from outside physicians, which showed:   From a thorough review of records, Medications tried that can be used in migraine management: Prozac, Toradol injections, Reglan, Zofran, Compazine,amitriptyline, propranolol contraindicated due to blood pressure, sumatriptan, rizatriptan, nurtec   Also reviewed dr hickling's notes from 2015 who diagnosed her tension type headaches and discussed conservative measures.     REVIEW OF SYSTEMS: Out of a complete 14 system review of symptoms, the patient complains only of the following symptoms, and all other reviewed systems are negative.  ALLERGIES: Allergies  Allergen Reactions   Other     Peaches     HOME MEDICATIONS: Outpatient Medications Prior to Visit  Medication Sig Dispense Refill   Cetirizine HCl (ZYRTEC PO) Take by mouth daily.     Drospirenone (SLYND) 4 MG TABS Take by mouth.     FLUoxetine (PROZAC) 40 MG capsule Take 40 mg by mouth daily.     ibuprofen (ADVIL,MOTRIN) 200 MG tablet Take 200 mg by mouth every 6 (six) hours as needed for headache (2 By mouth as needed).     ondansetron (ZOFRAN-ODT) 4 MG disintegrating tablet Take 1-2 tablets (4-8 mg total) by mouth every 8 (eight) hours as needed. 30 tablet 3   Probiotic Product (ALIGN) 4 MG CAPS Take by mouth.     zolmitriptan (ZOMIG) 5 MG nasal solution Place 1 spray into the nose as needed for migraine (at the onset of migriane). 6 each 11   No facility-administered medications prior to visit.    PAST MEDICAL  HISTORY: Past Medical History:  Diagnosis Date   ADHD    Allergic rhinitis    Anxiety    Depression    Migraines     PAST SURGICAL HISTORY: Past Surgical History:  Procedure Laterality Date   NO PAST SURGERIES      FAMILY HISTORY: Family History  Problem Relation Age of Onset   Migraines Father    Migraines Maternal Grandmother    Pulmonary fibrosis Maternal Grandfather        Died at 61   Heart disease Maternal Grandfather     SOCIAL HISTORY: Social History   Socioeconomic History   Marital status: Single    Spouse name: Not on file   Number of children: Not on file   Years of education: Not on file   Highest education level: Not on file  Occupational History  Not on file  Tobacco Use   Smoking status: Never   Smokeless tobacco: Never  Substance and Sexual Activity   Alcohol use: No   Drug use: No   Sexual activity: Never  Other Topics Concern   Not on file  Social History Narrative   Not on file   Social Drivers of Health   Financial Resource Strain: Not on file  Food Insecurity: Not on file  Transportation Needs: Not on file  Physical Activity: Not on file  Stress: Not on file  Social Connections: Unknown (02/01/2022)   Received [facility], [facility]   Social Network    Social Network: Not on file  Intimate Partner Violence: Unknown (02/01/2022)   Received from [employer], [facility]   HITS    Physically Hurt: Not on file    Insult or Talk Down To: Not on file    Threaten Physical Harm: Not on file    Scream or Curse: Not on file      PHYSICAL EXAM Generalized: Well developed, in no acute distress   Neurological examination  Mentation: Alert oriented to time, place, history taking. Follows all commands speech and language fluent Cranial nerve II-XII: Facial symmetry noted  DIAGNOSTIC DATA (LABS, IMAGING, TESTING) - I reviewed patient records, labs, notes, testing and imaging myself where available.  Lab Results   Component Value Date   HGB 15.3 01/04/2013   HCT 45.0 01/04/2013      Component Value Date/Time   NA 141 01/04/2013 2137   K 3.5 01/04/2013 2137   CL 104 01/04/2013 2137   GLUCOSE 106 (H) 01/04/2013 2137   BUN 10 01/04/2013 2137   CREATININE 0.90 01/04/2013 2137      ASSESSMENT AND PLAN 28 y.o. year old female  has a past medical history of ADHD, Allergic rhinitis, Anxiety, Depression, and Migraines. here with:  Migraine with aura   -continue zomig for abortive therapy -Currently happy with headache management.  Does not wish to add on any additional medication at this time. - Discussed:  There is increased risk for stroke in women with migraine with aura and a contraindication for the combined contraceptive pill for use by women who have migraine with aura. The risk for women with migraine without aura is lower. However other risk factors like smoking are far more likely to increase stroke risk than migraine. There is a recommendation for no smoking and for the use of OCPs without estrogen such as progestogen only pills particularly for women with migraine with aura.Marland Kitchen People who have migraine headaches with auras may be 3 times more likely to have a stroke caused by a blood clot, compared to migraine patients who don't see auras. Women who take hormone-replacement therapy may be 30 percent more likely to suffer a clot-based stroke than women not taking medication containing estrogen. Other risk factors like smoking and high blood pressure may be  much more important.   - FU in 3-4 months or sooner if needed     Butch Penny, MSN, NP-C 06/24/2023, 3:13 PM [facility] [address] [place], [place] [address] [phone]

## 2023-06-24 NOTE — Patient Instructions (Signed)
 Continue Zomig: info about medication attached.    There is increased risk for stroke in women with migraine with aura and a contraindication for the combined contraceptive pill for use by women who have migraine with aura. The risk for women with migraine without aura is lower. However other risk factors like smoking are far more likely to increase stroke risk than migraine. There is a recommendation for no smoking and for the use of OCPs without estrogen such as progestogen only pills particularly for women with migraine with aura.Marland Kitchen People who have migraine headaches with auras may be 3 times more likely to have a stroke caused by a blood clot, compared to migraine patients who don't see auras. Women who take hormone-replacement therapy may be 30 percent more likely to suffer a clot-based stroke than women not taking medication containing estrogen. Other risk factors like smoking and high blood pressure may be  much more important.

## 2024-03-23 ENCOUNTER — Encounter: Payer: Self-pay | Admitting: Nurse Practitioner

## 2024-03-26 ENCOUNTER — Other Ambulatory Visit: Payer: Self-pay | Admitting: Nurse Practitioner

## 2024-03-26 DIAGNOSIS — R7989 Other specified abnormal findings of blood chemistry: Secondary | ICD-10-CM

## 2024-03-26 DIAGNOSIS — L680 Hirsutism: Secondary | ICD-10-CM

## 2024-03-29 ENCOUNTER — Encounter: Payer: Self-pay | Admitting: Nurse Practitioner

## 2024-03-31 ENCOUNTER — Other Ambulatory Visit

## 2024-03-31 DIAGNOSIS — L680 Hirsutism: Secondary | ICD-10-CM

## 2024-03-31 DIAGNOSIS — R7989 Other specified abnormal findings of blood chemistry: Secondary | ICD-10-CM

## 2024-03-31 MED ORDER — IOPAMIDOL (ISOVUE-300) INJECTION 61%
100.0000 mL | Freq: Once | INTRAVENOUS | Status: AC | PRN
  Administered 2024-03-31: 100 mL via INTRAVENOUS

## 2024-06-15 ENCOUNTER — Telehealth: Admitting: Adult Health
